# Patient Record
Sex: Female | Born: 1965 | Hispanic: No | Marital: Married
Health system: Midwestern US, Community
[De-identification: ages and names within clinical notes are randomized; demographics above are authoritative.]

## PROBLEM LIST (undated history)

## (undated) DIAGNOSIS — I499 Cardiac arrhythmia, unspecified: Principal | ICD-10-CM

## (undated) HISTORY — PX: HERNIA REPAIR: SHX51

## (undated) HISTORY — PX: MYOMECTOMY: SHX85

## (undated) HISTORY — PX: OTHER SURGICAL HISTORY: SHX169

## (undated) HISTORY — DX: Cardiac arrhythmia, unspecified: I49.9

---

## 2001-11-30 ENCOUNTER — Other Ambulatory Visit: Admission: RE | Admit: 2001-11-30 | Discharge: 2001-11-30 | Payer: Self-pay | Admitting: Obstetrics and Gynecology

## 2001-12-15 ENCOUNTER — Encounter: Payer: Self-pay | Admitting: Obstetrics and Gynecology

## 2001-12-15 ENCOUNTER — Ambulatory Visit (HOSPITAL_COMMUNITY): Admission: RE | Admit: 2001-12-15 | Discharge: 2001-12-15 | Payer: Self-pay | Admitting: Obstetrics and Gynecology

## 2002-05-13 ENCOUNTER — Inpatient Hospital Stay (HOSPITAL_COMMUNITY): Admission: AD | Admit: 2002-05-13 | Discharge: 2002-05-16 | Payer: Self-pay | Admitting: Obstetrics and Gynecology

## 2003-08-05 ENCOUNTER — Encounter: Admission: RE | Admit: 2003-08-05 | Discharge: 2003-08-05 | Payer: Self-pay | Admitting: Gastroenterology

## 2004-12-18 ENCOUNTER — Other Ambulatory Visit: Admission: RE | Admit: 2004-12-18 | Discharge: 2004-12-18 | Payer: Self-pay | Admitting: Obstetrics and Gynecology

## 2006-03-27 IMAGING — CR DG CHEST 1V
1 series · 1 of 1 positions shown · non-contrast
Comparison: none

CLINICAL DATA: Positive PPD. 
 CHEST ONE VIEW: 
 Heart and mediastinal contours are within normal limits.  Lungs are clear.  No effusions.  Visualized skeleton unremarkable.

[view not recorded]
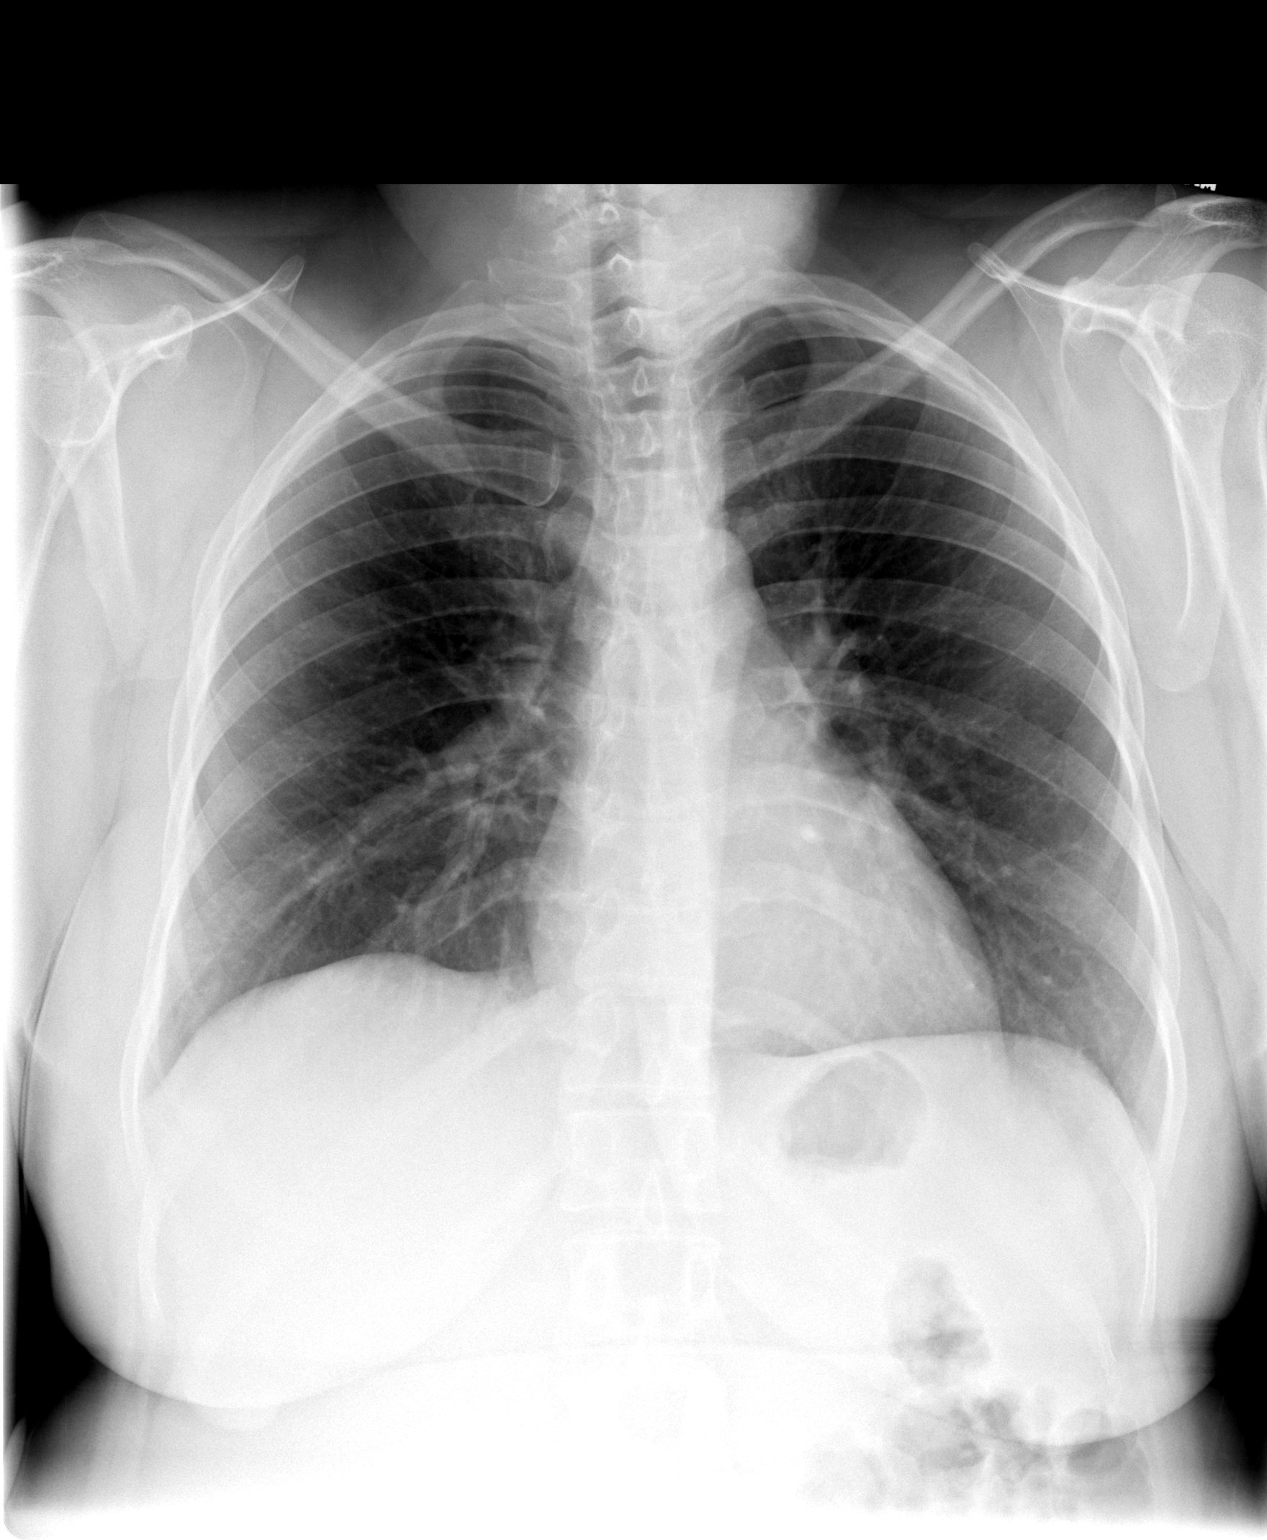

[1 of 1 positions shown; findings below may reference images not displayed]

IMPRESSION: No active disease.

## 2006-05-12 ENCOUNTER — Ambulatory Visit (HOSPITAL_COMMUNITY): Admission: RE | Admit: 2006-05-12 | Discharge: 2006-05-12 | Payer: Self-pay | Admitting: Obstetrics and Gynecology

## 2014-05-10 ENCOUNTER — Ambulatory Visit (INDEPENDENT_AMBULATORY_CARE_PROVIDER_SITE_OTHER): Payer: Self-pay | Admitting: Physician Assistant

## 2014-05-10 VITALS — BP 112/66 | HR 102 | Temp 98.6°F | Resp 16 | Ht 65.5 in | Wt 165.8 lb

## 2014-05-10 DIAGNOSIS — Z021 Encounter for pre-employment examination: Secondary | ICD-10-CM

## 2014-05-10 NOTE — Progress Notes (Signed)
   05/10/2014 at 5:54 PM  Sherry Cantrell / DOB: 05/27/1965 / MRN: [id number]  The patient  does not have a problem list on file.  SUBJECTIVE  Chief complaint: Work PE  Patient well appearing female here today for a work physical and has form with her.  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  She denies having any chronic illness in the past. She reports having a Tetanus shot in 2009.     She  has no past medical history on file.    Medications reviewed and updated by myself where necessary, and exist elsewhere in the encounter.   Sherry Cantrell has No Known Allergies. She  reports that she has never smoked. She does not have any smokeless tobacco history on file. She reports that she does not drink alcohol or use illicit drugs. She  has no sexual activity history on file. The patient  has past surgical history that includes Hernia repair; Cesarean section; and fibroid removed.  Her family history is not on file.  Review of Systems  Constitutional: Negative.   HENT: Negative.   Eyes: Negative.   Respiratory: Negative.   Cardiovascular: Negative.   Gastrointestinal: Negative.   Genitourinary: Negative.   Musculoskeletal: Negative.   Skin: Negative.   Neurological: Negative.   Endo/Heme/Allergies: Negative.   Psychiatric/Behavioral: Negative.     OBJECTIVE  Her  height is 5' 5.5" (1.664 m) and weight is 165 lb 12.8 oz (75.206 kg). Her oral temperature is 98.6 F (37 C). Her blood pressure is 112/66 and her pulse is 102. Her respiration is 16 and oxygen saturation is 100%.  The patient's body mass index is 27.16 kg/(m^2).  Physical Exam  Constitutional: She is oriented to person, place, and time. She appears well-developed and well-nourished.  HENT:  Head: Normocephalic.  Cardiovascular: Normal rate and regular rhythm.   Respiratory: Effort normal and breath sounds normal.  GI: Soft. Bowel sounds are normal.  Musculoskeletal: Normal range of motion.    Neurological: She is alert and oriented to person, place, and time.  Skin: Skin is warm and dry.  Psychiatric: She has a normal mood and affect. Her behavior is normal. Judgment and thought content normal.     Visual Acuity Screening   Right eye Left eye Both eyes  Without correction:     With correction: 20/25 20/25-1 20/25     No results found for this or any previous visit (from the past 24 hour(s)).  ASSESSMENT & PLAN  Sherry Cantrell was seen today for work pe.  Diagnoses and all orders for this visit:  Physical exam, pre-employment: TB skin test placed and to be read in 48-72 hours.  Awaiting titers.  She will keep her form with her and this will need to be initialed once TB skin test is read.  The remainder is completed.   Orders: -     Hepatitis B surface antibody -     TB Skin Test -     Measles/Mumps/Rubella Immunity   The patient was advised to call or come back to clinic if she does not see an improvement in symptoms, or worsens with the above plan.   Deliah BostonMichael Clark, MHS, PA-C [facility] 05/10/2014 5:54 PM

## 2014-05-10 NOTE — Patient Instructions (Signed)

## 2014-05-10 NOTE — Progress Notes (Signed)
  Tuberculosis Risk Questionnaire  1. Yes [place] Were you born outside the [place] in one of the following parts of the world: [place], [place], [place], [place] or [place]?    2. Yes [place] Have you traveled outside the [place] and lived for more than one month in one of the following parts of the world: [place], [place], [place], [place] or [place]?    3. No Do you have a compromised immune system such as from any of the following conditions:HIV/AIDS, organ or bone marrow transplantation, diabetes, immunosuppressive medicines (e.g. Prednisone, Remicaide), leukemia, lymphoma, cancer of the head or neck, gastrectomy or jejunal bypass, end-stage renal disease (on dialysis), or silicosis?     4. No Have you ever or do you plan on working in: a residential care center, a health care facility, a jail or prison or homeless shelter?    5. No Have you ever: injected illegal drugs, used crack cocaine, lived in a homeless shelter  or been in jail or prison?     6. No Have you ever been exposed to anyone with infectious tuberculosis?    Tuberculosis Symptom Questionnaire  Do you currently have any of the following symptoms?  1. No Unexplained cough lasting more than 3 weeks?   2. No Unexplained fever lasting more than 3 weeks.   3. No Night Sweats (sweating that leaves the bedclothes and sheets wet)     4. No Shortness of Breath   5. No Chest Pain   6. No Unintentional weight loss    7. No Unexplained fatigue (very tired for no reason)

## 2014-05-11 LAB — HEPATITIS B SURFACE ANTIBODY, QUANTITATIVE: Hepatitis B-Post: 1.3 m[IU]/mL

## 2014-05-11 LAB — MEASLES/MUMPS/RUBELLA IMMUNITY
Mumps IgG: 58.1 AU/mL — ABNORMAL HIGH (ref <9.00)
Rubella: 25.5 Index — ABNORMAL HIGH (ref <0.90)
Rubeola IgG: >300 AU/mL — ABNORMAL HIGH (ref <25.00)

## 2014-05-12 ENCOUNTER — Ambulatory Visit (INDEPENDENT_AMBULATORY_CARE_PROVIDER_SITE_OTHER): Payer: Self-pay | Admitting: Family Medicine

## 2014-05-12 ENCOUNTER — Encounter: Payer: Self-pay | Admitting: Physician Assistant

## 2014-05-12 ENCOUNTER — Ambulatory Visit (INDEPENDENT_AMBULATORY_CARE_PROVIDER_SITE_OTHER): Payer: Self-pay

## 2014-05-12 DIAGNOSIS — R7611 Nonspecific reaction to tuberculin skin test without active tuberculosis: Secondary | ICD-10-CM

## 2014-05-12 LAB — TB SKIN TEST
Induration: 19 mm
TB Skin Test: POSITIVE

## 2014-05-12 NOTE — Progress Notes (Signed)
[facility]ed and [facility]  She has been seen and evaluated by them in the past but has not been treated as it was not required. She has had 3 positive PPD now.  We will advise her not to get further PPD tests as they will be positive. She can have a repeat CXR or quantiferon gold if she prefers.    Bcg vaccne yesr ago. Has been with [facility] 3x with this issue. xr today and informed now more ppd placement.s

## 2014-05-12 NOTE — Progress Notes (Signed)
   Subjective:    Patient ID: Sherry LandrySiham Cantrell, female    DOB: 07/10/1965, 49 y.o.   MRN: [id number]  Chief Complaint  Patient presents with  . PPD Reading   HPI  89 yof returns for ppd placement reading.   Was seen 05/10/14 for physical exam for employment. Passed pending ppd results.   Returns today with 19 mm induration at ppd site.   She denies fevers, chills, wt loss, cough, night sweats, sob, chest pain.   She received BCG vaccine many years ago in her home country. She informs us today she has had positive ppd in past with neg cxr. Notes review shows positive ppd in 2005 with normal cxr.   Called and discussed with Tammy at [facility]. Pt has been seen and evaluated by them in the 3 times in the past for positive ppd but has not been treated as it was not required. Health dept recommends obtaining cxr today as pt is asx.   Review of Systems See HPI.     Objective:   Physical Exam  Constitutional: She is oriented to person, place, and time. She appears well-developed and well-nourished.  Non-toxic appearance. She does not have a sickly appearance. She does not appear ill. No distress.  Neurological: She is alert and oriented to person, place, and time.  Psychiatric: She has a normal mood and affect. Her speech is normal and behavior is normal.   [facility] reading (PRIMARY) by  Dr. Patsy Lageropland. Chest findings: Normal, no signs of tb.      Assessment & Plan:   89 yof returns for ppd placement reading.   Positive PPD - Plan: DG Chest 1 View --no constitutional sx, negative cxr, pt has had bcg vaccine in past, false positive ppd test --discussed with health dept, recommended to pt she should no longer get ppd placements as they will likely continue to be positive even if false  Donnajean Lopesodd M. Jadrian Bulman, PA-C Physician Assistant-Certified [facility]  05/12/2014 5:40 PM    Bringing back form to be signed

## 2014-05-16 ENCOUNTER — Telehealth: Payer: Self-pay

## 2014-05-16 NOTE — Telephone Encounter (Signed)
Labs

## 2014-05-16 NOTE — Telephone Encounter (Signed)
Pt missed her call. Please advise at [phone]

## 2014-05-16 NOTE — Telephone Encounter (Signed)
Patient is returning a missed phone call. Please call! [phone]

## 2014-05-16 NOTE — Telephone Encounter (Signed)
Gave pt. results

## 2014-05-16 NOTE — Telephone Encounter (Signed)
lmom to cb. 

## 2014-05-17 ENCOUNTER — Ambulatory Visit (INDEPENDENT_AMBULATORY_CARE_PROVIDER_SITE_OTHER): Payer: Self-pay

## 2014-05-17 DIAGNOSIS — Z23 Encounter for immunization: Secondary | ICD-10-CM

## 2014-06-16 ENCOUNTER — Ambulatory Visit (INDEPENDENT_AMBULATORY_CARE_PROVIDER_SITE_OTHER): Payer: Self-pay

## 2014-06-16 VITALS — Temp 98.3°F

## 2014-06-16 DIAGNOSIS — Z23 Encounter for immunization: Secondary | ICD-10-CM

## 2014-11-16 ENCOUNTER — Ambulatory Visit (INDEPENDENT_AMBULATORY_CARE_PROVIDER_SITE_OTHER): Payer: Self-pay | Admitting: Family Medicine

## 2014-11-16 DIAGNOSIS — Z23 Encounter for immunization: Secondary | ICD-10-CM

## 2014-11-16 NOTE — Patient Instructions (Signed)
Hepatitis B Vaccine, Recombinant injection  What is this medicine?  HEPATITIS B VACCINE (hep uh TAHY tis B VAK seen) is a vaccine. It is used to prevent an infection with the hepatitis B virus.  This medicine may be used for other purposes; ask your health care provider or pharmacist if you have questions.  What should I tell my health care provider before I take this medicine?  They need to know if you have any of these conditions:  -fever, infection  -heart disease  -hepatitis B infection  -immune system problems  -kidney disease  -an unusual or allergic reaction to vaccines, yeast, other medicines, foods, dyes, or preservatives  -pregnant or trying to get pregnant  -breast-feeding  How should I use this medicine?  This vaccine is for injection into a muscle. It is given by a health care professional.  A copy of Vaccine Information Statements will be given before each vaccination. Read this sheet carefully each time. The sheet may change frequently.  Talk to your pediatrician regarding the use of this medicine in children. While this drug may be prescribed for children as young as newborn for selected conditions, precautions do apply.  Overdosage: If you think you have taken too much of this medicine contact a poison control center or emergency room at once.  NOTE: This medicine is only for you. Do not share this medicine with others.  What if I miss a dose?  It is important not to miss your dose. Call your doctor or health care professional if you are unable to keep an appointment.  What may interact with this medicine?  -medicines that suppress your immune function like adalimumab, anakinra, infliximab  -medicines to treat cancer  -steroid medicines like prednisone or cortisone  This list may not describe all possible interactions. Give your health care provider a list of all the medicines, herbs, non-prescription drugs, or dietary supplements you use. Also tell them if you smoke, drink alcohol, or use illegal  drugs. Some items may interact with your medicine.  What should I watch for while using this medicine?  See your health care provider for all shots of this vaccine as directed. You must have 3 shots of this vaccine for protection from hepatitis B infection. Tell your doctor right away if you have any serious or unusual side effects after getting this vaccine.  What side effects may I notice from receiving this medicine?  Side effects that you should report to your doctor or health care professional as soon as possible:  -allergic reactions like skin rash, itching or hives, swelling of the face, lips, or tongue  -breathing problems  -confused, irritated  -fast, irregular heartbeat  -flu-like syndrome  -numb, tingling pain  -seizures  -unusually weak or tired  Side effects that usually do not require medical attention (report to your doctor or health care professional if they continue or are bothersome):  -diarrhea  -fever  -headache  -loss of appetite  -muscle pain  -nausea  -pain, redness, swelling, or irritation at site where injected  -tiredness  This list may not describe all possible side effects. Call your doctor for medical advice about side effects. You may report side effects to FDA at 1-800-FDA-1088.  Where should I keep my medicine?  This drug is given in a hospital or clinic and will not be stored at home.  NOTE: This sheet is a summary. It may not cover all possible information. If you have questions about this medicine, talk to   your doctor, pharmacist, or health care provider.     © 2016, [employer]vier/Gold Standard. (2013-05-10 13:26:01)

## 2014-12-18 NOTE — Progress Notes (Signed)
No provider encounter.  Only immunization encounter for Hepatitis B#3.

## 2015-03-31 LAB — LIPID PANEL: LDL Cholesterol: 118

## 2015-04-01 ENCOUNTER — Ambulatory Visit (INDEPENDENT_AMBULATORY_CARE_PROVIDER_SITE_OTHER): Payer: Self-pay | Admitting: Family Medicine

## 2015-04-01 VITALS — BP 110/70 | HR 91 | Temp 98.3°F | Resp 18 | Ht 66.0 in | Wt 162.0 lb

## 2015-04-01 DIAGNOSIS — J209 Acute bronchitis, unspecified: Secondary | ICD-10-CM

## 2015-04-01 MED ORDER — HYDROCODONE-HOMATROPINE 5-1.5 MG/5ML PO SYRP: 5.0000 mL | ORAL_SOLUTION | Freq: Three times a day (TID) | ORAL | Status: AC | PRN

## 2015-04-01 MED ORDER — AMOXICILLIN 500 MG PO CAPS: 500.0000 mg | ORAL_CAPSULE | Freq: Three times a day (TID) | ORAL | Status: AC

## 2015-04-01 NOTE — Patient Instructions (Signed)
     IF you received an x-ray today, you will receive an invoice from [facility]. Please contact [facility] at [phone] with questions or concerns regarding your invoice.   IF you received labwork today, you will receive an invoice from [facility]. Please contact [facility] at [phone] with questions or concerns regarding your invoice.   Our billing staff will not be able to assist you with questions regarding bills from these companies.  You will be contacted with the lab results as soon as they are available. The fastest way to get your results is to activate your My Chart account. Instructions are located on the last page of this paperwork. If you have not heard from us regarding the results in 2 weeks, please contact this office.      

## 2015-04-01 NOTE — Progress Notes (Signed)
Patient ID: Sherry LandrySiham Cantrell MRN: [id number], DOB: 01/12/1966, 50 y.o. Date of Encounter: 04/01/2015, 2:28 PM  Primary Physician: No primary care provider on file.  Chief Complaint:  Chief Complaint  Patient presents with  . Cough    since monday    HPI: 50 y.o. year old female presents with a 5 day history of nasal congestion, post nasal drip, sore throat, and cough. Mild sinus pressure. Afebrile. No chills. Nasal congestion thick and green/yellow. Cough is productive of green/yellow sputum and not associated with time of day. Ears feel full, leading to sensation of muffled hearing. Has tried OTC cold preps without success. No GI complaints.   No sick contacts, recent antibiotics, or recent travels.   No leg trauma, sedentary periods, h/o cancer, or tobacco use.  No past medical history on file.   Home Meds: Prior to Admission medications   Medication Sig Start Date End Date Taking? Authorizing Provider  ibuprofen (ADVIL,MOTRIN) 200 MG tablet Take 200 mg by mouth every 6 (six) hours as needed.   Yes Historical Provider, MD  amoxicillin (AMOXIL) 500 MG capsule Take 1 capsule (500 mg total) by mouth 3 (three) times daily. 04/01/15   Elvina SidleKurt Elam Ellis, MD  HYDROcodone-homatropine [facility](HYCODAN) 5-1.5 MG/5ML syrup Take 5 mLs by mouth every 8 (eight) hours as needed for cough. 04/01/15   Elvina SidleKurt Camp Gopal, MD    Allergies: No Known Allergies  Social History   Social History  . Marital Status: Married    Spouse Name: N/A  . Number of Children: N/A  . Years of Education: N/A   Occupational History  . Not on file.   Social History Main Topics  . Smoking status: Never Smoker   . Smokeless tobacco: Not on file  . Alcohol Use: No  . Drug Use: No  . Sexual Activity: Not on file   Other Topics Concern  . Not on file   Social History Narrative     Review of Systems: Constitutional: negative for chills, fever, night sweats or weight changes Cardiovascular: negative for chest pain or  palpitations Respiratory: negative for hemoptysis, wheezing, or shortness of breath Abdominal: negative for abdominal pain, nausea, vomiting or diarrhea Dermatological: negative for rash Neurologic: negative for headache   Physical Exam: Blood pressure 110/70, pulse 91, temperature 98.3 F (36.8 C), temperature source Oral, resp. rate 18, height 5\' 6"  (1.676 m), weight 162 lb (73.483 kg), SpO2 98 %., Body mass index is 26.16 kg/(m^2). General: Well developed, well nourished, in no acute distress. Head: Normocephalic, atraumatic, eyes without discharge, sclera non-icteric, nares are congested. Bilateral auditory canals clear, TM's are without perforation, pearly grey with reflective cone of light bilaterally. No sinus TTP. Oral cavity moist, dentition normal. Posterior pharynx with post nasal drip and mild erythema. No peritonsillar abscess or tonsillar exudate. Neck: Supple. No thyromegaly. Full ROM. No lymphadenopathy. Lungs: Coarse breath sounds bilaterally without wheezes, rales, or rhonchi. Breathing is unlabored.  Heart: RRR with S1 S2. No murmurs, rubs, or gallops appreciated. Msk:  Strength and tone normal for age. Extremities: No clubbing or cyanosis. No edema. Neuro: Alert and oriented X 3. Moves all extremities spontaneously. CNII-XII grossly in tact. Psych:  Responds to questions appropriately with a normal affect.      ASSESSMENT AND PLAN:  50 y.o. year old female with bronchitis. -   ICD-9-CM ICD-10-CM   1. Acute bronchitis, unspecified organism 466.0 J20.9 amoxicillin (AMOXIL) 500 MG capsule     HYDROcodone-homatropine (HYCODAN) 5-1.5 MG/5ML syrup   -Tylenol/Motrin prn -  Rest/fluids -RTC precautions -RTC 3-5 days if no improvement  Signed, Elvina Sidle, MD 04/01/2015 2:28 PM

## 2015-04-03 ENCOUNTER — Other Ambulatory Visit: Payer: Self-pay | Admitting: Internal Medicine

## 2015-04-03 DIAGNOSIS — Z1231 Encounter for screening mammogram for malignant neoplasm of breast: Secondary | ICD-10-CM

## 2015-04-18 ENCOUNTER — Ambulatory Visit
Admission: RE | Admit: 2015-04-18 | Discharge: 2015-04-18 | Disposition: A | Discharge status: Home / Self Care | Payer: Self-pay | Source: Ambulatory Visit | Attending: Internal Medicine | Admitting: Internal Medicine

## 2015-04-18 DIAGNOSIS — Z1231 Encounter for screening mammogram for malignant neoplasm of breast: Secondary | ICD-10-CM

## 2015-05-08 ENCOUNTER — Other Ambulatory Visit: Payer: Self-pay | Admitting: Internal Medicine

## 2015-05-08 DIAGNOSIS — R928 Other abnormal and inconclusive findings on diagnostic imaging of breast: Secondary | ICD-10-CM

## 2015-05-26 ENCOUNTER — Other Ambulatory Visit: Payer: Self-pay

## 2015-05-27 DIAGNOSIS — I499 Cardiac arrhythmia, unspecified: Secondary | ICD-10-CM

## 2015-05-27 HISTORY — DX: Cardiac arrhythmia, unspecified: I49.9

## 2015-05-29 ENCOUNTER — Other Ambulatory Visit: Payer: Self-pay | Admitting: Family Medicine

## 2015-05-29 DIAGNOSIS — R928 Other abnormal and inconclusive findings on diagnostic imaging of breast: Secondary | ICD-10-CM

## 2015-05-31 ENCOUNTER — Other Ambulatory Visit: Payer: Self-pay | Admitting: Family Medicine

## 2015-05-31 DIAGNOSIS — R928 Other abnormal and inconclusive findings on diagnostic imaging of breast: Secondary | ICD-10-CM

## 2015-06-13 ENCOUNTER — Other Ambulatory Visit (HOSPITAL_COMMUNITY): Payer: Self-pay | Admitting: *Deleted

## 2015-06-13 DIAGNOSIS — R928 Other abnormal and inconclusive findings on diagnostic imaging of breast: Secondary | ICD-10-CM

## 2015-06-16 ENCOUNTER — Other Ambulatory Visit: Payer: Self-pay

## 2015-06-22 ENCOUNTER — Ambulatory Visit
Admission: RE | Admit: 2015-06-22 | Discharge: 2015-06-22 | Disposition: A | Discharge status: Home / Self Care | Payer: No Typology Code available for payment source | Source: Ambulatory Visit | Attending: Family Medicine | Admitting: Family Medicine

## 2015-06-22 ENCOUNTER — Encounter (HOSPITAL_COMMUNITY): Payer: Self-pay

## 2015-06-22 ENCOUNTER — Ambulatory Visit (HOSPITAL_COMMUNITY)
Admission: RE | Admit: 2015-06-22 | Discharge: 2015-06-22 | Disposition: A | Discharge status: Home / Self Care | Payer: Self-pay | Source: Ambulatory Visit | Attending: Obstetrics and Gynecology | Admitting: Obstetrics and Gynecology

## 2015-06-22 VITALS — BP 104/68 | Temp 99.1°F | Ht 66.0 in | Wt 158.6 lb

## 2015-06-22 DIAGNOSIS — R928 Other abnormal and inconclusive findings on diagnostic imaging of breast: Secondary | ICD-10-CM

## 2015-06-22 DIAGNOSIS — Z01419 Encounter for gynecological examination (general) (routine) without abnormal findings: Secondary | ICD-10-CM

## 2015-06-22 NOTE — Progress Notes (Signed)
[facility] due to recommending additional imaging of the left breast. [facility]. Patient complained of left breast pain around 12 o'clock at times. Patient rates pain at a 7-8 out of 10.  Pap Smear: Pap smear completed today. [place] in [place] and normal per patient. Per patient has no history of an abnormal Pap smear. No Pap smear results are in EPIC.  Physical exam: Breasts Left breast larger than right breast that per patient is normal for her. No skin abnormalities bilateral breasts. No nipple retraction bilateral breasts. No nipple discharge bilateral breasts. No lymphadenopathy. No lumps palpated bilateral breasts. No complaints of pain or tenderness on exam. [facility] left breast diagnostic mammogram per recommendation. Appointment scheduled for Thursday, June 22, 2015 at 1540.  Pelvic/Bimanual   Ext Genitalia No lesions, no swelling and no discharge observed on external genitalia.         Vagina Vagina pink and normal texture. No lesions or discharge observed in vagina.          Cervix Cervix is present. Cervix pink and of normal texture. No discharge observed.     Uterus Uterus is present and palpable. Uterus in normal position and normal size.        Adnexae Bilateral ovaries present and palpable. No tenderness on palpation.          Rectovaginal No rectal exam completed today since patient had no rectal complaints. No skin abnormalities observed on exam.    Smoking History: Patient has never smoked.  Patient Navigation: Patient education provided. Access to services provided for patient through [facility] program.

## 2015-06-22 NOTE — Patient Instructions (Signed)
Educational materials on self breast awareness given. Explained to Sherry Cantrell that [facility] will cover Pap smears and HPV typing every 5 years unless has a history of abnormal Pap smears. [facility] for left breast diagnostic mammogram per recommendation. Appointment scheduled for Thursday, June 22, 2015 at 1540. Let patient know will follow up with her within the next couple weeks with results of Pap smear by phone. Sherry Cantrell verbalized understanding.  Sherry Cantrell, Sherry Maserhristine Poll, RN 4:10 PM

## 2015-06-23 ENCOUNTER — Encounter (HOSPITAL_COMMUNITY): Payer: Self-pay | Admitting: *Deleted

## 2015-06-23 LAB — CYTOLOGY - PAP

## 2015-07-05 ENCOUNTER — Telehealth (HOSPITAL_COMMUNITY): Payer: Self-pay | Admitting: *Deleted

## 2015-07-05 NOTE — Telephone Encounter (Signed)
Telephoned patient at home # and left message to return call to [facility] 

## 2015-07-19 ENCOUNTER — Telehealth (HOSPITAL_COMMUNITY): Payer: Self-pay | Admitting: *Deleted

## 2015-07-19 NOTE — Telephone Encounter (Signed)
Telephoned patient's phone # and not working

## 2015-07-26 ENCOUNTER — Encounter (HOSPITAL_COMMUNITY): Payer: Self-pay | Admitting: *Deleted

## 2015-07-26 ENCOUNTER — Telehealth (HOSPITAL_COMMUNITY): Payer: Self-pay | Admitting: *Deleted

## 2015-07-26 NOTE — Telephone Encounter (Signed)
Home number not working. Mailed letter to patient concerning normal pap smear results. Letter is in epic. Next pap smear is due in 5 years.

## 2015-08-01 ENCOUNTER — Telehealth (HOSPITAL_COMMUNITY): Payer: Self-pay | Admitting: *Deleted

## 2015-08-01 NOTE — Telephone Encounter (Signed)
Telephoned patient at home # and no answer. 

## 2015-08-29 ENCOUNTER — Encounter (HOSPITAL_COMMUNITY): Payer: Self-pay | Admitting: *Deleted

## 2015-08-29 NOTE — Progress Notes (Signed)
Letter sent to patient advising of normal pap smear. Unable to reach patient by phone.

## 2017-01-01 IMAGING — CR DG CHEST 1V
1 series · 1 of 1 positions shown · non-contrast
Comparison: None.

CLINICAL DATA: Positive PPD

EXAM:
CHEST  1 VIEW

[PA]
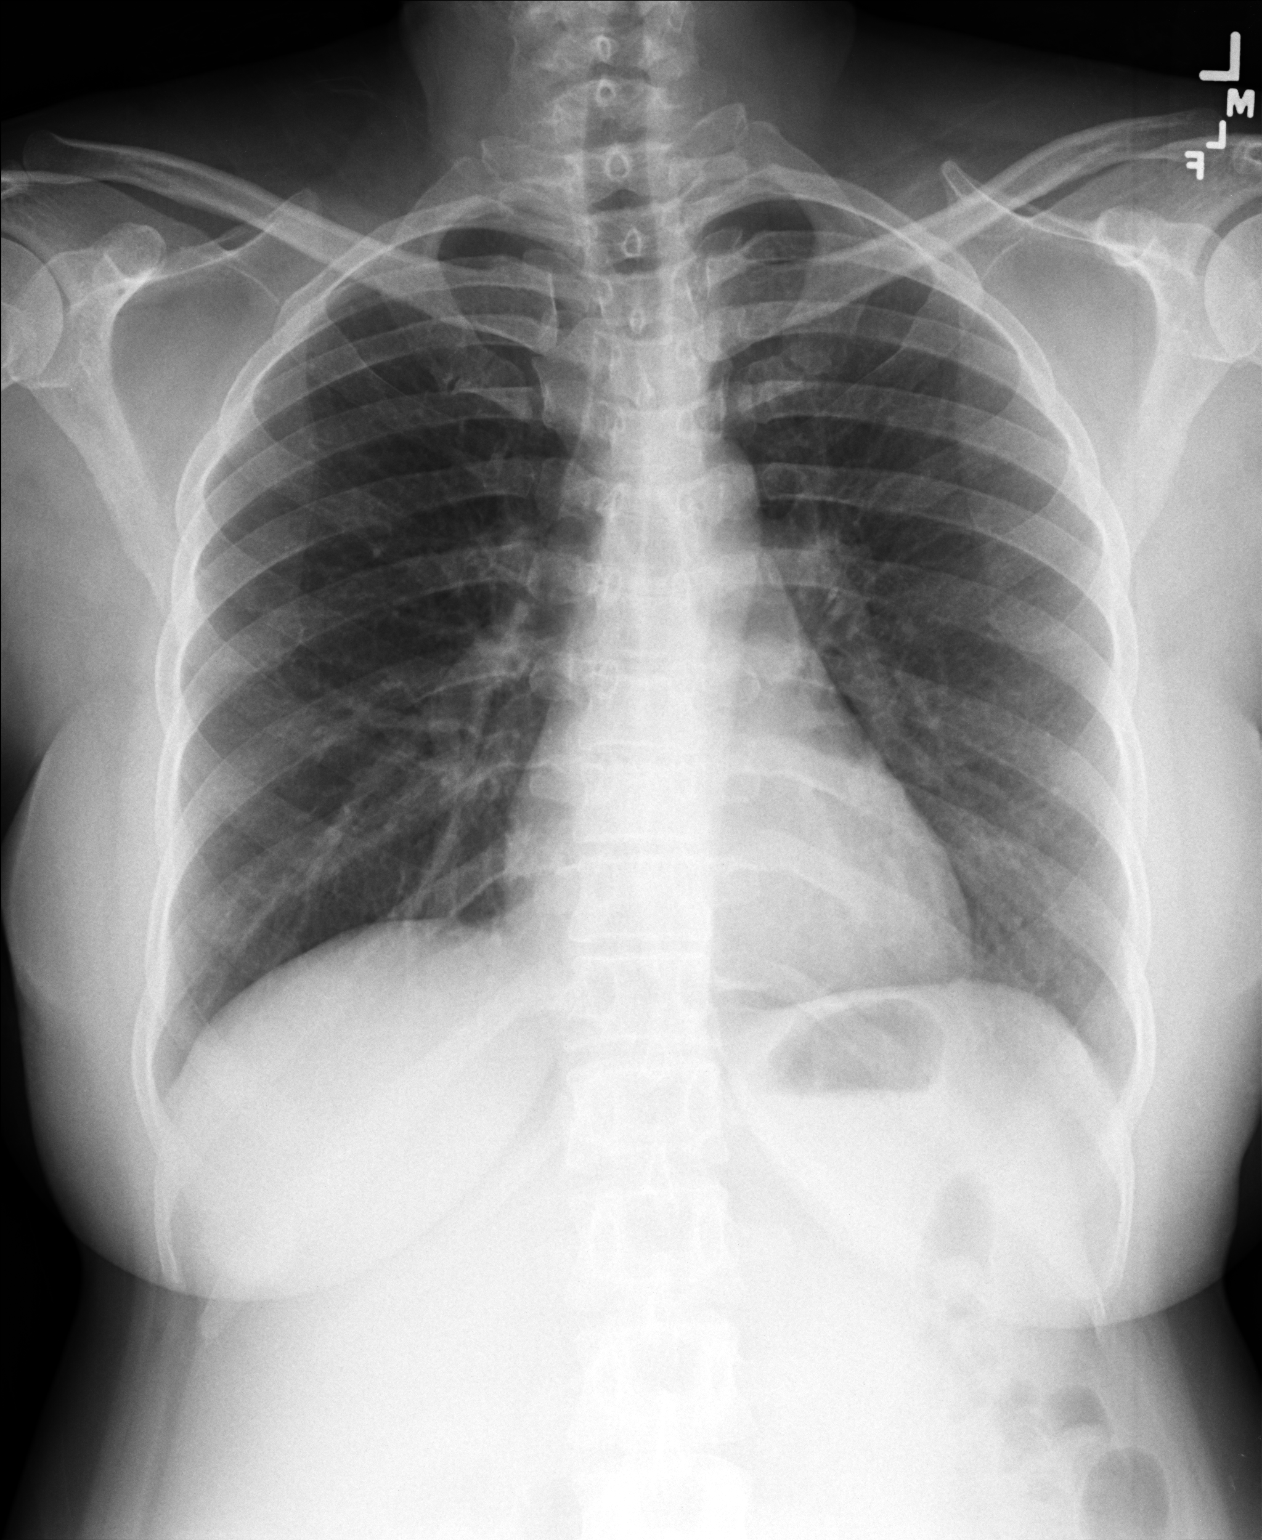

[1 of 1 positions shown; findings below may reference images not displayed]

FINDINGS: No active infiltrate or effusion is seen. No sequela of prior
tuberculous infection is seen. The heart is within normal limits in
size. No bony abnormality is noted.
IMPRESSION: No active disease.

## 2017-08-23 DIAGNOSIS — I499 Cardiac arrhythmia, unspecified: Secondary | ICD-10-CM

## 2017-08-23 NOTE — Assessment & Plan Note (Signed)
Causes dizziness

## 2019-10-20 LAB — COMPREHENSIVE METABOLIC PANEL
ALT: 14 U/L (ref 0–33)
AST: 16 U/L (ref 0–32)
Albumin/Globulin Ratio: 1.7 mmol/L (ref 1.00–2.70)
Albumin: 4.6 g/dL (ref 3.5–5.2)
Alk Phosphatase: 57 U/L (ref 35–117)
Anion Gap: 13 mmol/L (ref 2–17)
BUN: 12 mg/dL (ref 6–20)
CO2: 27 mmol/L (ref 22–29)
Calcium: 10.3 mg/dL — ABNORMAL HIGH (ref 8.6–10.0)
Chloride: 100 mmol/L (ref 98–107)
Creatinine: 0.5 mg/dL (ref 0.5–1.0)
GFR African American: 129 mL/min/{1.73_m2} (ref >=90)
GFR Non-African American: 111 mL/min/{1.73_m2} (ref >=90)
Globulin: 3 g/dL (ref 1.9–4.4)
Glucose: 95 mg/dL (ref 70–99)
OSMOLALITY CALCULATED: 279 mOsm/kg (ref 270–287)
Potassium: 4.2 mmol/L (ref 3.5–5.3)
Sodium: 140 mmol/L (ref 135–145)
Total Bilirubin: 1.1 mg/dL (ref 0.00–1.20)
Total Protein: 7.3 g/dL (ref 6.4–8.3)

## 2019-10-20 LAB — URINALYSIS WITH REFLEX TO CULTURE
Bilirubin Urine: NEGATIVE
Glucose, UA: NEGATIVE
Ketones, Urine: NEGATIVE
Leukocyte Esterase, Urine: NEGATIVE
MUCUS, URINE: NONE SEEN /LPF
Nitrite, Urine: NEGATIVE
Protein, UA: NEGATIVE
Specific Gravity, UA: 1.02 (ref 1.003–1.035)
Urobilinogen, Urine: 0.2 EU/dL
pH, UA: 7 (ref 4.5–8.0)

## 2019-10-20 LAB — LIPID PANEL
Chol/HDL Ratio: 4.8 — ABNORMAL HIGH (ref 0.0–4.4)
Cholesterol: 233 mg/dL — ABNORMAL HIGH (ref 100–200)
HDL: 49 mg/dL — ABNORMAL LOW (ref >=50)
LDL Cholesterol: 144.2 mg/dL — ABNORMAL HIGH (ref 0.0–100.0)
LDL/HDL Ratio: 2.9
Triglycerides: 199 mg/dL — ABNORMAL HIGH (ref 0–149)
VLDL: 39.8 mg/dL (ref 5.0–40.0)

## 2019-10-20 LAB — CBC WITH AUTO DIFFERENTIAL
Absolute Baso #: 0 10*3/uL (ref 0.0–0.2)
Absolute Eos #: 0.1 10*3/uL (ref 0.0–0.5)
Absolute Lymph #: 2.4 10*3/uL (ref 1.0–3.2)
Absolute Mono #: 0.4 10*3/uL (ref 0.3–1.0)
Basophils %: 0.3 % (ref 0.0–2.0)
Eosinophils %: 1.4 % (ref 0.0–7.0)
Hematocrit: 41 % (ref 34.0–47.0)
Hemoglobin: 13.1 g/dL (ref 11.5–15.7)
Immature Grans (Abs): 0.01 10*3/uL (ref 0.00–0.06)
Immature Granulocytes: 0.2 % (ref 0.1–0.6)
Lymphocytes: 41.6 % (ref 15.0–45.0)
MCH: 25 pg — ABNORMAL LOW (ref 27.0–34.5)
MCHC: 32 g/dL (ref 32.0–36.0)
MCV: 78.4 fL — ABNORMAL LOW (ref 81.0–99.0)
MPV: 12.1 fL (ref 7.2–13.2)
Monocytes: 6 % (ref 4.0–12.0)
NRBC Absolute: 0 10*3/uL (ref 0.000–0.012)
NRBC Automated: 0 % (ref 0.0–0.2)
Neutrophils %: 50.5 % (ref 42.0–74.0)
Neutrophils Absolute: 3 10*3/uL (ref 1.6–7.3)
Platelets: 251 10*3/uL (ref 140–440)
RBC: 5.23 x10e6/mcL — ABNORMAL HIGH (ref 3.60–5.20)
RDW: 13.7 % (ref 11.0–16.0)
WBC: 5.9 10*3/uL (ref 3.8–10.6)

## 2019-10-20 LAB — VITAMIN D 25 HYDROXY: Vit D, 25-Hydroxy: 28.2 ng/mL — ABNORMAL LOW (ref 30.0–90.0)

## 2019-10-20 LAB — TSH WITH REFLEX TO FT4: TSH: 2.07 mcIU/mL (ref 0.358–3.740)

## 2019-10-20 LAB — FOLLICLE STIMULATING HORMONE: FSH: 50.22 IU/mL

## 2020-12-28 ENCOUNTER — Ambulatory Visit
Admit: 2020-12-28 | Discharge: 2020-12-28 | Payer: BLUE CROSS/BLUE SHIELD | Attending: Family Medicine | Primary: Family Medicine

## 2020-12-28 DIAGNOSIS — M533 Sacrococcygeal disorders, not elsewhere classified: Secondary | ICD-10-CM

## 2020-12-28 MED ORDER — MAGNESIUM 400 MG PO CAPS
400 | ORAL_CAPSULE | Freq: Every evening | ORAL | 3 refills | Status: AC
Start: 2020-12-28 — End: 2023-10-27

## 2020-12-28 MED ORDER — IBUPROFEN 600 MG PO TABS
600 MG | ORAL_TABLET | ORAL | 1 refills | Status: DC
Start: 2020-12-28 — End: 2023-05-07

## 2020-12-28 NOTE — Progress Notes (Signed)
Katie Newman (DOB:  [birth year]) is a 55 y.o. 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 Anemia, unspecified type  -     CBC with Auto Differential; Future  -     Iron and TIBC; Future  3. Pure hypercholesterolemia  -     Comprehensive Metabolic Panel; Future  -     Lipid Panel; Future  4. Vitamin D deficiency  -     Vitamin D 25 Hydroxy; Future  5. Muscle ache  -     Magnesium 400 MG CAPS; Take 1 capsule by mouth at bedtime, Disp-90 capsule, R-3Normal  6. Hearing loss of right ear, unspecified hearing loss type  Katie Spring, MD - Otolaryngology  7. Other screening mammogram  -     MAM TOMO DIGITAL SCREEN BILATERAL; Future      Requested Prescriptions     Signed Prescriptions Disp Refills    Magnesium 400 MG CAPS 90 capsule 3     Sig: Take 1 capsule by mouth at bedtime    ibuprofen (ADVIL;MOTRIN) 600 MG tablet 90 tablet 1     Sig: 1 tablet with food or milk as needed Orally Three times a day      No results found for any visits on 12/28/20.     Return for Routine Follow Up.    Pt  to call if hasn't heard back from our office or specialist's office or scheduler or via MyChart re: referrals, labs results, or radiology results in the next week            Subjective   SUBJECTIVE/OBJECTIVE:  HPI  LBP-across back. thinks sometimes is muscles, but wants to make sure is kidneys. sometimes has sciatica but went to PT and helped.   Hand-sometimes bothers her.  Menopause-FSH high.  LMP May 2022.   Vit D def-taking 5,000 IU QD  R ear-did a hearing test and was not good in the right.       Current Outpatient Medications   Medication Sig Dispense Refill    ferrous sulfate  (IRON 325) 325 (65 Fe) MG tablet Take 325 mg by mouth daily      Magnesium 400 MG CAPS Take 1 capsule by mouth at bedtime 90 capsule 3    ibuprofen (ADVIL;MOTRIN) 600 MG tablet 1 tablet with food or milk as needed Orally Three times a day 90 tablet 1    Cholecalciferol 50 MCG (2000 UT) TABS 1 tablet Orally Once a day for 30 day(s)       No current facility-administered medications for this visit.      No Known Allergies     Past Medical History:   Diagnosis Date    Anemia     Anxiety       Patient Care Team:  Marilynn Latino, MD as PCP - General     Past Surgical History:   Procedure Laterality Date    CESAREAN SECTION  2010    CESAREAN SECTION  2011    HERNIA REPAIR  2012    MYOMECTOMY  2009  Uterine Fibroids      Social History     Tobacco Use    Smoking status: Never    Smokeless tobacco: Never   Vaping Use    Vaping Use: Never used   Substance Use Topics    Alcohol use: Never    Drug use: Never      Family History   Problem Relation Age of Onset    No Known Problems Mother     Heart Attack Father     No Known Problems Sister     No Known Problems Sister     No Known Problems Brother     No Known Problems Brother     No Known Problems Brother     No Known Problems Brother     No Known Problems Brother     No Known Problems Brother     No Known Problems Maternal Grandmother         Unknown Family Hx    No Known Problems Maternal Grandfather         Unknown Family Hx    No Known Problems Paternal Grandmother         Unknown Family Hx    No Known Problems Paternal Grandfather         Unknown Family Hx    No Known Problems Daughter     No Known Problems Son     No Known Problems Son     No Known Problems Son     No Known Problems Other           Review of Systems       Objective   BP 110/80 (Site: Left Upper Arm, Position: Sitting)    Pulse 90    Temp 98.9 ??F (37.2 ??C)    Ht 5\' 6"  (1.676 m)    Wt 163 lb (73.9 kg)    SpO2 97%    BMI 26.31 kg/m??    Body mass index is 26.31 kg/m??.     Physical Exam  Vitals reviewed.    Constitutional:       General: She is not in acute distress.     Appearance: Normal appearance. She is not ill-appearing.   HENT:      Head: Normocephalic and atraumatic.      Right Ear: Tympanic membrane, ear canal and external ear normal.      Left Ear: Tympanic membrane, ear canal and external ear normal.      Mouth/Throat:      Mouth: Mucous membranes are moist.   Eyes:      Extraocular Movements: Extraocular movements intact.      Conjunctiva/sclera: Conjunctivae normal.      Pupils: Pupils are equal, round, and reactive to light.   Neck:      Vascular: No carotid bruit.   Cardiovascular:      Rate and Rhythm: Normal rate and regular rhythm.      Heart sounds: No murmur heard.    No friction rub. No gallop.   Pulmonary:      Breath sounds: Normal breath sounds.   Musculoskeletal:      Cervical back: Normal range of motion and neck supple.      Right lower leg: No edema.      Left lower leg: No edema.   Lymphadenopathy:      Cervical: No cervical adenopathy.   Neurological:      Mental Status: She is alert.      Cranial Nerves:  No cranial nerve deficit.      Sensory: No sensory deficit.      Motor: No weakness.      Gait: Gait normal.      Deep Tendon Reflexes: Reflexes normal.   Psychiatric:         Mood and Affect: Mood normal.         Behavior: Behavior normal.         Thought Content: Thought content normal.         Judgment: Judgment normal.       No results found for: LABA1C  No results found for: EAG    Lab Results   Component Value Date    NA 140 10/20/2019    K 4.2 10/20/2019    CL 100 10/20/2019    CO2 27 10/20/2019    BUN 12 10/20/2019    CREATININE 0.5 10/20/2019    GLUCOSE 95 10/20/2019    CALCIUM 10.3 (H) 10/20/2019    PROT 7.3 10/20/2019    LABALBU 4.6 10/20/2019    BILITOT 1.10 10/20/2019    ALKPHOS 57 10/20/2019    AST 16 10/20/2019    ALT 14 10/20/2019    LABGLOM 111 10/20/2019    GFRAA 129 10/20/2019    GLOB 3.0 10/20/2019       Lab Results   Component Value Date    WBC 5.9 10/20/2019    HGB  13.1 10/20/2019    HCT 41.0 10/20/2019    MCV 78.4 (L) 10/20/2019    PLT 251 10/20/2019    LYMPHOPCT 41.6 10/20/2019    RBC 5.23 (H) 10/20/2019    MCH 25.0 (L) 10/20/2019    MCHC 32.0 10/20/2019    RDW 13.7 10/20/2019         No results found for: TSH, T3TOTAL, T4TOTAL, THYROIDAB, FT3, T4FREE    Lab Results   Component Value Date    CHOL 233 (H) 10/20/2019     Lab Results   Component Value Date    TRIG 199 (H) 10/20/2019     Lab Results   Component Value Date    HDL 49 (L) 10/20/2019     Lab Results   Component Value Date    LDLCHOLESTEROL 144.2 (H) 10/20/2019     Lab Results   Component Value Date    VLDL 39.8 10/20/2019     Lab Results   Component Value Date    CHOLHDLRATIO 4.8 (H) 10/20/2019         Mammogram Result (most recent):  MAM TOMO DIGITAL DIAGNOSTIC BILATERAL 07/04/2020    Narrative  ** Final **      Reason for exam: Clinical finding      PROCEDURE:  MG Mammo Diagnostic Tomo Bilateral: July 04, 2020 - [employer] ID: [id number]      Prior Study Comparison: 04/06/2020 Bilateral MG Mammo Screen Global Tomo Bilat, DMR.    There are scattered fibroglandular densities.  The asymmetry seen in both breasts at recent screening mammography is demonstrated to represent  superimposition of normal breast structures at diagnostic mammography. Outside studies are now  available for comparison and the appearance is similar to prior studies from 2019.  There are no mammographic features of malignancy in the right breast.    Impression  No suspicious mammographic finding in the right breast. Recommend return to routine annual screening  mammogram schedule.      BI-RADS: Negative (1) (Overall)      RECOMMENDATION:  Screening Mammogram of both breasts in  1 year.    To Meet FDA/MQSA Regulations, a letter has been sent to the patient informing the patient of the findings.      Releasing Radiologist:   Lynita Lombard N-MD  Released Date and Time:  07/04/20 12:15                  An electronic signature was used to  authenticate this note.    --Marilynn Latino, MD

## 2022-06-20 NOTE — Telephone Encounter (Signed)
[place] 1st available for a physical is Aug 28 can patient appt be switched or can she be seen for a regular f/u

## 2022-06-20 NOTE — Telephone Encounter (Signed)
Patient  stated that she can not she is working and wont be able to come in until 3:15pm

## 2022-06-25 ENCOUNTER — Encounter: Payer: BLUE CROSS/BLUE SHIELD | Attending: Family Medicine | Primary: Family Medicine

## 2023-03-07 ENCOUNTER — Ambulatory Visit: Admit: 2023-03-07 | Discharge: 2023-03-07 | Payer: BLUE CROSS/BLUE SHIELD

## 2023-03-07 VITALS — BP 108/66 | HR 99 | Resp 14 | Ht 66.0 in | Wt 164.5 lb

## 2023-03-07 DIAGNOSIS — N644 Mastodynia: Secondary | ICD-10-CM

## 2023-03-07 MED ORDER — DICLOFENAC SODIUM 1 % EX GEL
1 | Freq: Four times a day (QID) | CUTANEOUS | 2 refills | Status: AC
Start: 2023-03-07 — End: ?

## 2023-03-07 MED ORDER — ALBUTEROL SULFATE HFA 108 (90 BASE) MCG/ACT IN AERS
10890 | Freq: Four times a day (QID) | RESPIRATORY_TRACT | 3 refills | Status: AC | PRN
Start: 2023-03-07 — End: ?

## 2023-03-07 MED ORDER — FLUTICASONE PROPIONATE 50 MCG/ACT NA SUSP
50 | Freq: Every day | NASAL | 2 refills | Status: AC
Start: 2023-03-07 — End: ?

## 2023-03-07 NOTE — Progress Notes (Signed)
 Katie Newman (DOB:  01-Sep-1965) is a 58 y.o. female, here for evaluation of the following chief complaint(s):  Follow-up (Left shoulder pain that radiates to the lower back and left breast that comes and goes. ), Cough, and Shoulder Pain        Assessment & Plan   ASSESSMENT/PLAN:  1. Breast pain, left  -     MAM TOMO DIGITAL DIAGNOSTIC BILATERAL (PER PROTOCOL); Future  2. Physical exam  -     CBC with Auto Differential; Future  -     Comprehensive Metabolic Panel; Future  -     Lipid Panel; Future  -     Hemoglobin A1C; Future  -     Vitamin D 25 Hydroxy; Future  -     Ferritin; Future  3. Microcytosis  -     Ferritin; Future  4. Vitamin D deficiency  5. Pure hypercholesterolemia      Assessment & Plan  1. Left shoulder pain/Left Breast Pain  The etiology of the pain could be muscular, potentially exacerbated by her recent illness and associated coughing. There is no evidence of pneumonia on lung examination. A topical analgesic gel will be prescribed for application on the affected area to alleviate the discomfort. She is advised to perform stretching exercises. A bilateral mammogram will be ordered as she is due for this screening. The mammogram will be conducted per protocol, with the option for an ultrasound if necessary.    2. Cough.  The cough appears to originate from nasal drainage rather than a pulmonary source. There is no evidence of pneumonia on lung examination. Flonase will be prescribed to reduce nasal swelling and mucus production. She is advised to continue using her inhaler, which has previously provided relief.    3. Health maintenance.  A bilateral mammogram will be ordered as she is due for this screening. The mammogram will be conducted per protocol, with the option for an ultrasound if necessary. Routine blood work will be ordered prior to her physical examination, including a recheck of her vitamin D level and iron level due to previous microcytosis.       I confirm that I have managed the  broad scope of the patient's health needs by furnishing care for some or all the patient's acute and/or chronic conditions across a spectrum of diagnoses and organ systems that will require ongoing care with myself or someone on my team.    No follow-ups on file.       Subjective   SUBJECTIVE/OBJECTIVE:  HPI     History of Present Illness  The patient is a 58 year old female who presents for evaluation of left shoulder pain, cough, and health maintenance.    She has been experiencing persistent left shoulder pain, which she attributes to an injury sustained in 2015 or 2017. The pain had subsided but recurred approximately 3 to 4 weeks ago. The pain is located to her left shoulder with radiation to her L latissimus and L outer breast. She reports no history of arthritis or rotator cuff injuries in the shoulder. She also reports no presence of lymph nodes, bumps in her armpits, or drainage from her breasts. She does not have any rashes or itching. She does not experience pain during deep inhalation.    She contracted influenza on 02/16/2023, which was accompanied by a cough. Despite medication, the cough persists. She sought treatment at an urgent care facility where she was prescribed antibiotics for an ear infection and inhalers. She continues  to experience chest pain and expresses concern about potential pneumonia. She has not measured her temperature but reports feeling fatigued and short of breath over the past few days. Her cough is intermittent, sometimes accompanied by wheezing. She took her medication this morning. She has found relief from using an inhaler, which she has been using since November 2024. She has a history of pneumonia from the previous year.    She missed her annual physical examination in June 2024 due to travel. She is interested in getting blood work done before her next physical examination.    SOCIAL HISTORY  She works in a school system.    MEDICATIONS  Current: ibuprofen       Prior to  Admission medications    Medication Sig Start Date End Date Taking? Authorizing Provider   diclofenac sodium (VOLTAREN) 1 % GEL Apply 2 g topically 4 times daily 03/07/23  Yes Amando Ishikawa, Tera Mater, MD   fluticasone (FLONASE) 50 MCG/ACT nasal spray 1 spray by Each Nostril route daily 03/07/23  Yes Dustie Brittle, Tera Mater, MD   albuterol sulfate HFA (PROVENTIL;VENTOLIN;PROAIR) 108 (90 Base) MCG/ACT inhaler Inhale 2 puffs into the lungs every 6 hours as needed for Wheezing 03/07/23  Yes Eimy Plaza, Tera Mater, MD   ferrous sulfate (IRON 325) 325 (65 Fe) MG tablet Take 1 tablet by mouth daily   Yes [provider]   Magnesium 400 MG CAPS Take 1 capsule by mouth at bedtime 12/28/20 03/07/23 Yes Pagan, Noemi R, MD   ibuprofen (ADVIL;MOTRIN) 600 MG tablet 1 tablet with food or milk as needed Orally Three times a day 12/28/20  Yes Pagan, Noemi R, MD   Cholecalciferol 50 MCG (2000 UT) TABS 5000   Yes [facility] [employer] Reconciliation, [facility], MD       No Known Allergies    Past Medical History:   Diagnosis Date    Anemia     Anxiety        Past Surgical History:   Procedure Laterality Date    CESAREAN SECTION  2010    CESAREAN SECTION  2011    HERNIA REPAIR  2012    MYOMECTOMY  2009    Uterine Fibroids         Family History   Problem Relation Age of Onset    No Known Problems Mother     Heart Attack Father     No Known Problems Sister     No Known Problems Sister     No Known Problems Brother     No Known Problems Brother     No Known Problems Brother     No Known Problems Brother     No Known Problems Brother     No Known Problems Brother     No Known Problems Maternal Grandmother         Unknown Family Hx    No Known Problems Maternal Grandfather         Unknown Family Hx    No Known Problems Paternal Grandmother         Unknown Family Hx    No Known Problems Paternal Grandfather         Unknown Family Hx    No Known Problems Daughter     No Known Problems Son     No Known Problems Son     No Known Problems Son     No Known Problems Other         Social History  Tobacco Use    Smoking status: Never     Passive exposure: Never    Smokeless tobacco: Never   Vaping Use    Vaping status: Never Used   Substance Use Topics    Alcohol use: Never    Drug use: Never          Review of Systems     Objective       Vitals:    03/07/23 1120   BP: 108/66   Site: Left Upper Arm   Position: Sitting   Cuff Size: Large Adult   Pulse: 99   Resp: 14   SpO2: 98%   Weight: 74.6 kg (164 lb 8 oz)   Height: 1.676 m (5\' 6" )        BP Readings from Last 3 Encounters:   03/07/23 108/66   12/28/20 110/80     Wt Readings from Last 3 Encounters:   03/07/23 74.6 kg (164 lb 8 oz)   12/28/20 73.9 kg (163 lb)       Physical Exam  Constitutional:       Appearance: Normal appearance.   HENT:      Head: Normocephalic and atraumatic.      Right Ear: Tympanic membrane normal.      Left Ear: Tympanic membrane normal.      Nose: Congestion and rhinorrhea present.      Mouth/Throat:      Mouth: Mucous membranes are moist.   Eyes:      Pupils: Pupils are equal, round, and reactive to light.   Cardiovascular:      Rate and Rhythm: Normal rate and regular rhythm.   Pulmonary:      Effort: Pulmonary effort is normal. No respiratory distress.      Breath sounds: No stridor. No wheezing, rhonchi or rales.   Chest:      Chest wall: Tenderness (mild but somewhat hard to reproduce) present.   Musculoskeletal:      Cervical back: Normal range of motion.   Skin:     General: Skin is warm and dry.   Neurological:      Mental Status: She is alert.                No results found for any previous visit.      No results found for this visit on 03/07/23.     The patient (or guardian, if applicable) and other individuals in attendance with the patient were advised that Artificial Intelligence will be utilized during this visit to record and process the conversation to generate a clinical note. The patient (or guardian, if applicable) and other individuals in attendance at the appointment consented to the  use of AI, including the recording.                An electronic signature was used to authenticate this note.    --Letitia Caul, MD

## 2023-05-07 ENCOUNTER — Ambulatory Visit: Admit: 2023-05-07 | Discharge: 2023-05-07 | Payer: BLUE CROSS/BLUE SHIELD

## 2023-05-07 VITALS — BP 122/80 | HR 69 | Resp 18 | Ht 66.0 in | Wt 163.0 lb

## 2023-05-07 DIAGNOSIS — Z0000 Encounter for general adult medical examination without abnormal findings: Principal | ICD-10-CM

## 2023-05-07 LAB — CBC WITH AUTO DIFFERENTIAL
Basophils %: 1 %
Basophils Absolute: 0 10*3/uL (ref 0.0–0.2)
Eosinophils %: 1 %
Eosinophils Absolute: 0.1 10*3/uL (ref 0.0–0.4)
Hematocrit: 41.4 % (ref 34.0–46.6)
Hemoglobin: 13.1 g/dL (ref 11.1–15.9)
Immature Grans (Abs): 0 10*3/uL (ref 0.0–0.1)
Immature Granulocytes %: 0 %
Lymphocytes %: 39 %
Lymphocytes Absolute: 2.3 10*3/uL (ref 0.7–3.1)
MCH: 25.5 pg — ABNORMAL LOW (ref 26.6–33.0)
MCHC: 31.6 g/dL (ref 31.5–35.7)
MCV: 81 fL (ref 79–97)
Monocytes %: 5 %
Monocytes Absolute: 0.3 10*3/uL (ref 0.1–0.9)
Neutrophils %: 54 %
Neutrophils Absolute: 3.2 10*3/uL (ref 1.4–7.0)
Platelets: 273 10*3/uL (ref 150–450)
RBC: 5.14 x10E6/uL (ref 3.77–5.28)
RDW: 14.2 % (ref 11.7–15.4)
WBC: 5.9 10*3/uL (ref 3.4–10.8)

## 2023-05-07 LAB — FERRITIN: Ferritin: 69 ng/mL (ref 15–150)

## 2023-05-07 LAB — VITAMIN D 25 HYDROXY: Vit D, 25-Hydroxy: 51.8 ng/mL (ref 30.0–100.0)

## 2023-05-07 LAB — HEMOGLOBIN A1C: Hemoglobin A1C: 6 % — ABNORMAL HIGH (ref 4.8–5.6)

## 2023-05-07 MED ORDER — IBUPROFEN 800 MG PO TABS
800 | ORAL_TABLET | Freq: Two times a day (BID) | ORAL | 1 refills | Status: AC | PRN
Start: 2023-05-07 — End: ?

## 2023-05-07 NOTE — Progress Notes (Signed)
 Katie Newman (DOB:  01/12/1966) is a 58 y.o. female, here for evaluation of the following chief complaint(s):  Annual Exam (Physical, side hip/nerve pain )        Assessment & Plan   ASSESSMENT/PLAN:  1. Physical exam  2. Vitamin D deficiency  3. Microcytosis  4. Prediabetes  -     Hemoglobin A1C; Future  -     Comprehensive Metabolic Panel; Future  5. Dyslipidemia  -     Lipid Panel; Future  -     Comprehensive Metabolic Panel; Future  6. Encounter for screening mammogram for malignant neoplasm of breast  -     MAM TOMO DIGITAL SCREEN BILATERAL (PER PROTOCOL); Future  7. Colon cancer screening  -     [facility] - Gastroenterology, [place]  8. Cervical cancer screening  -     [facility] - Suzanna Obey, MD, Obstetrics & Gynecology, [address]  9. Right hip pain      Assessment & Plan  1. Hip pain.  - The etiology of the pain could be multifactorial, potentially stemming from venous insufficiency, varicose veins, or sciatica.  - She has been advised to incorporate stretching exercises into her nightly routine.  - Additionally, the use of vitamin K2 has been recommended to aid in the absorption of vitamin D and potentially alleviate nocturnal cramping.  - A prescription for ibuprofen 800 mg has been provided, with instructions to consume it with food.    2. Prediabetes.  - Her blood glucose levels have been observed to be slightly elevated, placing her in the prediabetic range with an A1c of 6.0.  - She has been counseled to reduce her carbohydrate intake and increase resistance exercises such as walking and lunges.  - A follow-up lab test will be conducted in 6 months to monitor her blood glucose levels.  - No medication is required at this stage, but progression will be monitored closely.    3. Health maintenance.  - Her vitamin D levels are within the optimal range at 51, and her iron levels are satisfactory at 69.  - Her blood count has improved, indicating that she is no longer anemic. She has  been advised to continue her current regimen of vitamin D and iron supplements.  - A mammogram has been ordered, and she has been encouraged to schedule it. A referral for colon cancer screening has been made, and she will be contacted to arrange an appointment.  - A Pap smear has been recommended, and a referral to an OB/GYN has been provided. She will receive the shingles vaccine today, with the second dose scheduled for 2 to 6 months from now.    Follow-up  - The patient will follow up in 6 months.       I confirm that I have managed the broad scope of the patient's health needs by furnishing care for some or all the patient's acute and/or chronic conditions across a spectrum of diagnoses and organ systems that will require ongoing care with myself or someone on my team.    Return in about 6 months (around 11/06/2023).       Subjective   SUBJECTIVE/OBJECTIVE:  HPI     History of Present Illness  The patient presents for evaluation of hip pain, prediabetes, and health maintenance.    She reports experiencing intermittent hip pain, which she attributes to a nerve issue. The pain is severe enough to disrupt sleep and often manifests as a cramping  sensation. Visible veins are noted in the thigh area. The pain is predominantly localized to the right side, with occasional discomfort on the left. No associated back pain or tingling sensations in the toes are reported. The pain is most pronounced at night, particularly when lying down, but can also occur during the day. No stretching exercises are performed before bedtime. A history of sciatica is noted, for which surgery was undergone in the past. Current symptoms may be due to a pinched nerve, with some improvement reported on one side. Vitamin D supplements are taken, and an anti-inflammatory cream is occasionally used for pain management. A refill of ibuprofen 800 mg is requested.    Recent blood work indicates a vitamin D level of 51, which is optimal, and an iron  level of 69, with no signs of anemia. However, blood sugar levels are in the prediabetic range, with an A1c of 6.0. No prior diagnosis of high blood sugar or high blood pressure is reported, with historically low blood sugar levels.    Health maintenance includes pending mammogram and colon cancer screening. The last Pap smear was conducted in 2017, yielding normal results. Interest in receiving the shingles vaccine today is expressed. Slight improvement in breast tenderness since the last visit is attributed to the use of a prescribed cream.    PAST SURGICAL HISTORY:  - Sciatica surgery: Date     FAMILY HISTORY  She reports no family history of diabetes.       Prior to Admission medications    Medication Sig Start Date End Date Taking? Authorizing Provider   ibuprofen (ADVIL;MOTRIN) 800 MG tablet Take 1 tablet by mouth 2 times daily as needed for Pain 05/07/23  Yes Deja Kaigler, Lenis Quin, MD   diclofenac sodium (VOLTAREN) 1 % GEL Apply 2 g topically 4 times daily 03/07/23  Yes Sevana Grandinetti, Lenis Quin, MD   fluticasone (FLONASE) 50 MCG/ACT nasal spray 1 spray by Each Nostril route daily 03/07/23  Yes Emmanuella Mirante, Lenis Quin, MD   albuterol sulfate HFA (PROVENTIL;VENTOLIN;PROAIR) 108 (90 Base) MCG/ACT inhaler Inhale 2 puffs into the lungs every 6 hours as needed for Wheezing 03/07/23  Yes Koren Plyler, Lenis Quin, MD   ferrous sulfate (IRON 325) 325 (65 Fe) MG tablet Take 1 tablet by mouth daily   Yes [provider]   Cholecalciferol 50 MCG (2000 UT) TABS 5000   Yes [facility] Automatic Reconciliation, [facility], MD   Magnesium 400 MG CAPS Take 1 capsule by mouth at bedtime 12/28/20 03/07/23  Pagan, Rosaria Common, MD       No Known Allergies    Past Medical History:   Diagnosis Date    Anemia     Anxiety        Past Surgical History:   Procedure Laterality Date    CESAREAN SECTION  2010    CESAREAN SECTION  2011    HERNIA REPAIR  2012    MYOMECTOMY  2009    Uterine Fibroids         Family History   Problem Relation Age of Onset    No Known Problems  Mother     Heart Attack Father     No Known Problems Sister     No Known Problems Sister     No Known Problems Brother     No Known Problems Brother     No Known Problems Brother     No Known Problems Brother     No Known Problems Brother  No Known Problems Brother     No Known Problems Maternal Grandmother         Unknown Family Hx    No Known Problems Maternal Grandfather         Unknown Family Hx    No Known Problems Paternal Grandmother         Unknown Family Hx    No Known Problems Paternal Grandfather         Unknown Family Hx    No Known Problems Daughter     No Known Problems Son     No Known Problems Son     No Known Problems Son     No Known Problems Other        Social History     Tobacco Use    Smoking status: Never     Passive exposure: Never    Smokeless tobacco: Never   Vaping Use    Vaping status: Never Used   Substance Use Topics    Alcohol use: Never    Drug use: Never          Review of Systems     Objective       Vitals:    05/07/23 0906   BP: 122/80   BP Site: Right Upper Arm   Patient Position: Sitting   BP Cuff Size: Medium Adult   Pulse: 69   Resp: 18   SpO2: 98%   Weight: 73.9 kg (163 lb)   Height: 1.676 m (5\' 6" )        BP Readings from Last 3 Encounters:   05/07/23 122/80   03/07/23 108/66   12/28/20 110/80     Wt Readings from Last 3 Encounters:   05/07/23 73.9 kg (163 lb)   03/07/23 74.6 kg (164 lb 8 oz)   12/28/20 73.9 kg (163 lb)       Physical Exam  Vitals reviewed.   Constitutional:       Appearance: Normal appearance. She is normal weight.   HENT:      Head: Normocephalic and atraumatic.      Nose: Nose normal.      Mouth/Throat:      Mouth: Mucous membranes are moist.      Pharynx: Oropharynx is clear.   Eyes:      Extraocular Movements: Extraocular movements intact.      Pupils: Pupils are equal, round, and reactive to light.   Cardiovascular:      Rate and Rhythm: Normal rate and regular rhythm.      Heart sounds: No murmur heard.     No friction rub. No gallop.   Pulmonary:       Effort: Pulmonary effort is normal.      Breath sounds: Normal breath sounds.   Musculoskeletal:         General: Tenderness (to right thigh, right knee) present. Normal range of motion.      Cervical back: Normal range of motion.   Skin:     General: Skin is warm and dry.   Neurological:      General: No focal deficit present.      Mental Status: She is alert. Mental status is at baseline.                Orders Only on 05/06/2023   Component Date Value Ref Range Status    WBC 05/06/2023 5.9  3.4 - 10.8 x10E3/uL Final    RBC 05/06/2023 5.14  3.77 - 5.28  x10E6/uL Final    Hemoglobin 05/06/2023 13.1  11.1 - 15.9 g/dL Final    Hematocrit 16/10/9602 41.4  34.0 - 46.6 % Final    MCV 05/06/2023 81  79 - 97 fL Final    MCH 05/06/2023 25.5 (L)  26.6 - 33.0 pg Final    MCHC 05/06/2023 31.6  31.5 - 35.7 g/dL Final    RDW 54/09/8117 14.2  11.7 - 15.4 % Final    Platelets 05/06/2023 273  150 - 450 x10E3/uL Final    Neutrophils % 05/06/2023 54  Not Estab. % Final    Lymphocytes % 05/06/2023 39  Not Estab. % Final    Monocytes % 05/06/2023 5  Not Estab. % Final    Eosinophils % 05/06/2023 1  Not Estab. % Final    Basophils % 05/06/2023 1  Not Estab. % Final    Neutrophils Absolute 05/06/2023 3.2  1.4 - 7.0 x10E3/uL Final    Lymphocytes Absolute 05/06/2023 2.3  0.7 - 3.1 x10E3/uL Final    Monocytes Absolute 05/06/2023 0.3  0.1 - 0.9 x10E3/uL Final    Eosinophils Absolute 05/06/2023 0.1  0.0 - 0.4 x10E3/uL Final    Basophils Absolute 05/06/2023 0.0  0.0 - 0.2 x10E3/uL Final    Immature Granulocytes % 05/06/2023 0  Not Estab. % Final    Immature Grans (Abs) 05/06/2023 0.0  0.0 - 0.1 x10E3/uL Final    Hemoglobin A1C 05/06/2023 6.0 (H)  4.8 - 5.6 % Final    Comment:          Prediabetes: 5.7 - 6.4           Diabetes: >6.4           Glycemic control for adults with diabetes: <7.0      Vit D, 25-Hydroxy 05/06/2023 51.8  30.0 - 100.0 ng/mL Final    Comment: Vitamin D deficiency has been defined by the Institute of  Medicine and an  Endocrine Society practice guideline as a  level of serum 25-OH vitamin D less than 20 ng/mL (1,2).  The Endocrine Society went on to further define vitamin D  insufficiency as a level between 21 and 29 ng/mL (2).  1. IOM (Institute of Medicine). 2010. Dietary reference     intakes for calcium and D. [place]: The     [employer].  2. Holick MF, Binkley NC, Bischoff-Ferrari HA, et al.     Evaluation, treatment, and prevention of vitamin D     deficiency: an Endocrine Society clinical practice     guideline. JCEM. 2011 Jul; 96(7):1911-30.      Ferritin 05/06/2023 69  15 - 150 ng/mL Final      No results found for this visit on 05/07/23.     The patient (or guardian, if applicable) and other individuals in attendance with the patient were advised that Artificial Intelligence will be utilized during this visit to record and process the conversation to generate a clinical note. The patient (or guardian, if applicable) and other individuals in attendance at the appointment consented to the use of AI, including the recording.                An electronic signature was used to authenticate this note.    --Flavia Hughs, MD

## 2023-05-08 NOTE — Telephone Encounter (Signed)
 NP scheduled appt for 7/28 @ 11:30am w/Dr. Higdon @ [facility].  Please close referral

## 2023-05-26 ENCOUNTER — Encounter: Attending: Family Medicine

## 2023-08-18 ENCOUNTER — Ambulatory Visit: Admit: 2023-08-18 | Discharge: 2023-08-18 | Payer: BLUE CROSS/BLUE SHIELD | Attending: Obstetrics & Gynecology

## 2023-08-18 MED ORDER — ESTRADIOL 0.1 MG/GM VA CREA
0.1 | Freq: Every day | VAGINAL | 3 refills | 30.00000 days | Status: AC
Start: 2023-08-18 — End: ?

## 2023-08-18 NOTE — Progress Notes (Signed)
 Katie Newman 58 y.o. [id number] who presents today for GYN annual.        PCP: Dr. Ronn   LMP: May 2022  Last Pap: 2017  Pap History:  denies abnormal   Colonoscopy: never done   Mammogram: 2022- negative     Social History     Substance and Sexual Activity   Sexual Activity Yes    Partners: Male    Birth control/protection: Post-menopausal       Issues with intercourse: sometimes pain from dryness   Depression screen:         03/07/2023    11:20 AM   PHQ-9    Little interest or pleasure in doing things 0   Feeling down, depressed, or hopeless 0   PHQ-2 Score 0   PHQ-9 Total Score 0        Negative deprssion     Review of Systems   Constitutional:  Negative for fatigue and fever.   Respiratory:  Negative for cough.    Cardiovascular:  Negative for chest pain.   Gastrointestinal:  Negative for nausea and vomiting.   Genitourinary:  Negative for dysuria and vaginal discharge.   Skin:  Negative for rash.   Neurological:  Negative for headaches.   Psychiatric: Negative for depression/anxiety.     Social History:   Tobacco Use: Low Risk  (08/18/2023)    Patient History     Smoking Tobacco Use: Never     Smokeless Tobacco Use: Never     Passive Exposure: Never     Social History     Substance and Sexual Activity   Alcohol Use Never     Social History     Substance and Sexual Activity   Drug Use Never         OB History       Gravida   6    Para   4    Term   4    Preterm        AB   2    Living   4         SAB   2    IAB        Ectopic        Molar        Multiple        Live Births   4              Past Surgical History:   Procedure Laterality Date    CESAREAN SECTION  2010    CESAREAN SECTION  2011    HERNIA REPAIR  2012    MYOMECTOMY  2009    Uterine Fibroids        Past Medical History:   Diagnosis Date    Anemia     Anxiety          Prior to Admission medications    Medication Sig Start Date End Date Taking? Authorizing Provider   estradiol  (ESTRACE  VAGINAL) 0.1 MG/GM vaginal cream Place 1 g vaginally daily Nightly x 2  week, every other night x 2 weeks, then 3x/week or prn 08/18/23  Yes Analeah Brame, Delon Johnston [place], MD   ibuprofen  (ADVIL ;MOTRIN ) 800 MG tablet Take 1 tablet by mouth 2 times daily as needed for Pain 05/07/23  Yes Purser, Garnette LABOR, MD   diclofenac  sodium (VOLTAREN ) 1 % GEL Apply 2 g topically 4 times daily 03/07/23  Yes Purser, Garnette LABOR, MD   fluticasone  (FLONASE ) 50 MCG/ACT  nasal spray 1 spray by Each Nostril route daily 03/07/23  Yes Purser, Garnette LABOR, MD   albuterol  sulfate HFA (PROVENTIL ;VENTOLIN ;PROAIR ) 108 (90 Base) MCG/ACT inhaler Inhale 2 puffs into the lungs every 6 hours as needed for Wheezing 03/07/23  Yes Purser, Garnette LABOR, MD   ferrous sulfate (IRON 325) 325 (65 Fe) MG tablet Take 1 tablet by mouth daily   Yes [provider]   Cholecalciferol 50 MCG (2000 UT) TABS 5000   Yes [facility] Automatic Reconciliation, [facility], MD   Magnesium  400 MG CAPS Take 1 capsule by mouth at bedtime 12/28/20 03/07/23  Pagan, Sherel SAUNDERS, MD     No Known Allergies  Family History   Problem Relation Age of Onset    No Known Problems Mother     Heart Attack Father         2021    No Known Problems Sister     No Known Problems Sister     No Known Problems Brother     No Known Problems Brother     No Known Problems Brother     No Known Problems Brother     No Known Problems Brother     No Known Problems Brother     No Known Problems Maternal Grandmother         Unknown Family Hx    No Known Problems Maternal Grandfather         Unknown Family Hx    No Known Problems Paternal Grandmother         Unknown Family Hx    No Known Problems Paternal Grandfather         Unknown Family Hx    No Known Problems Daughter     No Known Problems Son     No Known Problems Son     No Known Problems Son     No Known Problems Other        Vitals:    08/18/23 1136   BP: (!) 96/58       PE: Gen: No acute distress, alert and oriented, affect appropriate  CV: Regular rate   Pulm: Nonlabored respirations  Abd: Soft, nontender, nondistended, no masses  palpated  Breast: Symmetric bilaterally, nipples appear normal, no retractions/dimpling, no masses/lesions, no axillary lymphadenopathy  GU:   External female genitalia: normal, no masses/lesions, introital narrowing  Speculum exam:  normal vaginal mucosa, no blood, normal appearing vaginal discharge, no masses/lesions, normal cervix with no masses/lesion  Bimanual exam: No cervical motion tenderness, normal size uterus, mobile, nontender, no adnexal masses, no adnexal tenderness  Ext: No edema      1. Women's annual routine gynecological examination  Overview:  -pap with HPV today  -reordered mammogram so they will call her again, also gave scheduling number  -ordered cologuard as scared to do colonoscopy  -discussed atrophy and estrogen vaginally ordered   2. Cervical cancer screening  -     PAP IG, Aptima HPV and rfx 16/18,45 ([id number])  ([facility] DEFAULT)  3. Screening for human papillomavirus  -     PAP IG, Aptima HPV and rfx 16/18,45 ([id number])  ([facility] DEFAULT)  4. Encounter for screening mammogram for breast cancer  -     MAM TOMO DIGITAL SCREEN BILATERAL  5. Colon cancer screening  -     Cologuard (Fecal DNA Colorectal Cancer Screening)  6. Vaginal atrophy  -     estradiol  (ESTRACE  VAGINAL) 0.1 MG/GM vaginal cream; Place 1 g vaginally daily Nightly x 2  week, every other night x 2 weeks, then 3x/week or prn, Disp-42.5 g, R-3Normal       Return in about 1 year (around 08/17/2024) for annual.     Delon Johnston Levora Verma, MD

## 2023-08-20 LAB — PAP IG, APTIMA HPV AND RFX 16/18,45 (199305)
.: 0
HPV Aptima: NEGATIVE

## 2023-10-27 ENCOUNTER — Ambulatory Visit: Admit: 2023-10-27 | Discharge: 2023-10-27 | Payer: BLUE CROSS/BLUE SHIELD

## 2023-10-27 VITALS — BP 110/77 | HR 88 | Wt 171.0 lb

## 2023-10-27 DIAGNOSIS — R7303 Prediabetes: Principal | ICD-10-CM

## 2023-10-27 NOTE — Progress Notes (Signed)
 "  Katie Newman (DOB:  07-Apr-1965) is a 58 y.o. female, here for evaluation of the following chief complaint(s):  Follow-up and Dizziness (Happ fri)        Assessment & Plan   ASSESSMENT/PLAN:  1. Prediabetes  -     AMB POC HEMOGLOBIN A1C  2. Vitamin D deficiency  3. Encounter for screening mammogram for malignant neoplasm of breast  4. Fluid collection of middle ear  5. Dizziness      Assessment & Plan  1. Dizziness:  - The dizziness may be attributed to fluid accumulation in the right ear, potentially due to exposure to a virus or allergies.   - Physical exam findings include fluid in the right ear and swelling in the right nostril.   - Flonase  is recommended for use in the right nostril to reduce swelling and facilitate fluid drainage from the ear, which should alleviate the dizziness.    2. Prediabetes:  - A1c has improved from 6.0 to 5.7%. She is advised to maintain adequate hydration, engage in regular physical activity, and limit intake of sugar and carbohydrates.    3. Health Maintenance:  - Her weight has increased slightly since the last visit.  - Her Cologuard test was negative, indicating no need for retesting for the next 3 years. The Pap smear was also negative, suggesting a 5-year interval before the next test. Her vitamin D levels have improved.  - She is advised to schedule a mammogram at her earliest convenience and was provided the number  - Today, she will receive the Prevnar 20 pneumonia vaccine and complete her shingles series.       I confirm that I have managed the broad scope of the patient's health needs by furnishing care for some or all the patient's acute and/or chronic conditions across a spectrum of diagnoses and organ systems that will require ongoing care with myself or someone on my team.    No follow-ups on file.       Subjective   SUBJECTIVE/OBJECTIVE:  HPI     History of Present Illness  The patient presents for evaluation of dizziness and prediabetes.    She experienced a brief  episode of dizziness at work on Friday, which resolved after resting at home. She describes a sensation of heaviness in her ear but reports no current symptoms. The dizziness was not constant and was accompanied by a feeling of imbalance. She reports no visual disturbances such as black spots or spinning sensations. She also reports no nasal congestion or sneezing. She received a flu shot last month.    She has not been monitoring her diet closely, particularly her intake of sugar and carbohydrates. During a month of fasting, she consumes a significant amount of sugar at night. She occasionally checks her blood sugar levels at home, with the most recent reading being 84 on Friday when she felt dizzy. She has not observed any readings in the mid-100s or 200s range.    She has not yet undergone a mammogram due to scheduling issues. She is due for her second shingles vaccine, having received her first shingles vaccine in April 2025.    Diet: Consumes a significant amount of sugar at night during fasting       Prior to Admission medications   Medication Sig Start Date End Date Taking? Authorizing Provider   estradiol  (ESTRACE  VAGINAL) 0.1 MG/GM vaginal cream Place 1 g vaginally daily Nightly x 2 week, every other night x 2 weeks, then  3x/week or prn 08/18/23  Yes Higdon, Delon [place], MD   ibuprofen  (ADVIL ;MOTRIN ) 800 MG tablet Take 1 tablet by mouth 2 times daily as needed for Pain 05/07/23  Yes Hinata Diener, Garnette LABOR, MD   diclofenac  sodium (VOLTAREN ) 1 % GEL Apply 2 g topically 4 times daily 03/07/23  Yes Anni Hocevar, Garnette LABOR, MD   fluticasone  (FLONASE ) 50 MCG/ACT nasal spray 1 spray by Each Nostril route daily 03/07/23  Yes Oakleigh Hesketh, Garnette LABOR, MD   albuterol  sulfate HFA (PROVENTIL ;VENTOLIN ;PROAIR ) 108 (90 Base) MCG/ACT inhaler Inhale 2 puffs into the lungs every 6 hours as needed for Wheezing 03/07/23  Yes Persais Ethridge, Garnette LABOR, MD   ferrous sulfate (IRON 325) 325 (65 Fe) MG tablet Take 1 tablet by mouth daily   Yes  [provider]   Magnesium  400 MG CAPS Take 1 capsule by mouth at bedtime 12/28/20 10/27/23 Yes Pagan, Noemi R, MD   Cholecalciferol 50 MCG (2000 UT) TABS 5000   Yes [facility] Automatic Reconciliation, [facility], MD       No Known Allergies    Past Medical History:   Diagnosis Date    Anemia     Anxiety        Past Surgical History:   Procedure Laterality Date    CESAREAN SECTION  2010    CESAREAN SECTION  2011    HERNIA REPAIR  2012    MYOMECTOMY  2009    Uterine Fibroids         Family History   Problem Relation Age of Onset    No Known Problems Mother     Heart Attack Father         2021    No Known Problems Sister     No Known Problems Sister     No Known Problems Brother     No Known Problems Brother     No Known Problems Brother     No Known Problems Brother     No Known Problems Brother     No Known Problems Brother     No Known Problems Maternal Grandmother         Unknown Family Hx    No Known Problems Maternal Grandfather         Unknown Family Hx    No Known Problems Paternal Grandmother         Unknown Family Hx    No Known Problems Paternal Grandfather         Unknown Family Hx    No Known Problems Daughter     No Known Problems Son     No Known Problems Son     No Known Problems Son     No Known Problems Other        Social History     Tobacco Use    Smoking status: Never     Passive exposure: Never    Smokeless tobacco: Never   Vaping Use    Vaping status: Never Used   Substance Use Topics    Alcohol use: Never    Drug use: Never          Review of Systems     Objective       Vitals:    10/27/23 1337   BP: 110/77   BP Site: Right Upper Arm   Patient Position: Sitting   BP Cuff Size: Large Adult   Pulse: 88   SpO2: 98%   Weight: 77.6 kg (171 lb)  BP Readings from Last 3 Encounters:   10/27/23 110/77   08/18/23 (!) 96/58   05/07/23 122/80     Wt Readings from Last 3 Encounters:   10/27/23 77.6 kg (171 lb)   08/18/23 75.7 kg (166 lb 14.4 oz)   05/07/23 73.9 kg (163 lb)       Physical Exam  Vitals  reviewed.   Constitutional:       Appearance: Normal appearance. She is normal weight.   HENT:      Head: Normocephalic and atraumatic.      Ears:      Comments: R MEF       Nose: Nose normal.      Mouth/Throat:      Mouth: Mucous membranes are moist.      Pharynx: Oropharynx is clear.   Eyes:      Extraocular Movements: Extraocular movements intact.      Pupils: Pupils are equal, round, and reactive to light.   Cardiovascular:      Rate and Rhythm: Normal rate and regular rhythm.      Heart sounds: No murmur heard.     No friction rub. No gallop.   Pulmonary:      Effort: Pulmonary effort is normal.      Breath sounds: Normal breath sounds.   Musculoskeletal:         General: Normal range of motion.      Cervical back: Normal range of motion.   Skin:     General: Skin is warm and dry.   Neurological:      General: No focal deficit present.      Mental Status: She is alert. Mental status is at baseline.                Office Visit on 08/18/2023   Component Date Value Ref Range Status    Diagnosis: 08/18/2023 Comment   Final    NEGATIVE FOR INTRAEPITHELIAL LESION OR MALIGNANCY.    Specimen adequacy: 08/18/2023 Comment   Final    Comment: Satisfactory for evaluation. Endocervical and/or squamous metaplastic  cells (endocervical component) are present.      Clinician Provided ICD 08/18/2023 Comment   Final    Comment: Z12.4  Z11.51      Performed by: 08/18/2023 Comment   Final    Talphine Crank-Haire, Cytologist (ASCP)    . 08/18/2023 .   Final    Note: 08/18/2023 Comment   Final    Comment: The Pap smear is a screening test designed to aid in the detection of  premalignant and malignant conditions of the uterine cervix.  It is not a  diagnostic procedure and should not be used as the sole means of detecting  cervical cancer.  Both false-positive and false-negative reports do occur.      Methodology: 08/18/2023 CANCELED   Final-Edited    Comment: The Thin Prep(R) Imager was unable to read this specimen.  Therefore  a  manual review was performed.    Result canceled by the ancillary.      HPV Aptima 08/18/2023 Negative  Negative Final    Comment: This nucleic acid amplification test detects fourteen high-risk  HPV types (16,18,31,33,35,39,45,51,52,56,58,59,66,68) without  differentiation.      HPV Genotype Reflex 08/18/2023 Comment   Final    Criteria not met, HPV Genotype not performed.    FIT-DNA (Cologuard) 08/31/2023 Negative  Negative Final    Comment: The Cologuard (TM) test was performed on this specimen.  NEGATIVE TEST RESULT. A negative Cologuard result indicates a low likelihood that a colorectal cancer (CRC) or advanced adenoma (adenomatous polyps with more advanced pre-malignant features) is present. The chance that a person with a negative Cologuard test has a colorectal cancer is less than 1 in 1500 (negative predictive value >99.9%) or has an advanced adenoma is less than 5.3% (negative predictive value 94.7%). These data are based on a prospective cross-sectional study of 10,000 individuals at average risk for colorectal cancer who were screened with both Cologuard and colonoscopy. (Imperiale T. et al, N Engl J Med 2014;370(14):1286-1297) The normal value (reference range) for this assay is negative.    COLOGUARD RE-SCREENING RECOMMENDATION: Periodic colorectal cancer screening is an important part of preventive healthcare for asymptomatic individuals at average risk for colorectal cancer. Following a negative Cologuard                            result, the American Cancer Society and U.S. Multi-Society Task Force screening guidelines recommend a Cologuard re-screening interval of 3 years.   References: American Cancer Society Guideline for Colorectal Cancer Screening: https://www.cancer.org/cancer/colon-rectal-cancer/detection-diagnosis-staging/acs-recommendations.html.; Rex DK, Boland CR, Dominitz JK, Colorectal Cancer Screening: Recommendations for Physicians and Patients from the U.S. Multi-Society Task  Force on Colorectal Cancer Screening , Am J Gastroenterology 2017; 112:1016-1030.    TEST DESCRIPTION: Composite algorithmic analysis of stool DNA-biomarkers with hemoglobin immunoassay.   Quantitative values of individual biomarkers are not reportable and are not associated with individual biomarker result reference ranges. Cologuard is intended for colorectal cancer screening of adults of either sex, 45 years or older, who are at average-risk for colorectal cancer (CRC). Cologuard has been approved for use by the [place]                            FDA. The performance of Cologuard was established in a cross sectional study of average-risk adults aged 53-84. Cologuard performance in patients ages 86 to 28 years was estimated by sub-group analysis of near-age groups. Colonoscopies performed for a positive result may find as the most clinically significant lesion: colorectal cancer [4.0%], advanced adenoma (including sessile serrated polyps greater than or equal to 1cm diameter) [20%] or non- advanced adenoma [31%]; or no colorectal neoplasia [45%]. These estimates are derived from a prospective cross-sectional screening study of 10,000 individuals at average risk for colorectal cancer who were screened with both Cologuard and colonoscopy. (Imperiale T. et al, LOISE [place] J Med 2014;370(14):1286-1297.) Cologuard may produce a false negative or false positive result (no colorectal cancer or precancerous polyp present at colonoscopy follow up). A negative Cologuard test result does not guarantee the absence of CRC or advanced adenoma                            (pre-cancer). The current Cologuard screening interval is every 3 years. Science Writer and [place] Therapist, [employer]). Cologuard performance data in a 10,000 patient pivotal study using colonoscopy as the reference method can be accessed at the following location: www.[employer].com/results. Additional description of the Cologuard test process, warnings and  precautions can be found at www.cologuard.com.        No results found for this visit on 10/27/23.     The patient (or guardian, if applicable) and other individuals in attendance with the patient were advised that Artificial Intelligence will be utilized during this visit to  record and process the conversation to generate a clinical note. The patient (or guardian, if applicable) and other individuals in attendance at the appointment consented to the use of AI, including the recording.                An electronic signature was used to authenticate this note.    --Garnette DELENA Berne, MD   "
